# Patient Record
Sex: Male | Born: 1975 | Race: Black or African American | Hispanic: No | Marital: Married
Health system: Southern US, Community
[De-identification: ages and names within clinical notes are randomized; demographics above are authoritative.]

## PROBLEM LIST (undated history)

## (undated) DIAGNOSIS — I10 Essential (primary) hypertension: Secondary | ICD-10-CM

---

## 2018-10-03 ENCOUNTER — Emergency Department (HOSPITAL_COMMUNITY): Payer: Self-pay

## 2018-10-03 ENCOUNTER — Other Ambulatory Visit: Payer: Self-pay

## 2018-10-03 ENCOUNTER — Emergency Department (HOSPITAL_COMMUNITY)
Admission: EM | Admit: 2018-10-03 | Discharge: 2018-10-03 | Disposition: A | Discharge status: Home / Self Care | Payer: Self-pay | Attending: Emergency Medicine | Admitting: Emergency Medicine

## 2018-10-03 ENCOUNTER — Encounter (HOSPITAL_COMMUNITY): Payer: Self-pay | Admitting: Emergency Medicine

## 2018-10-03 DIAGNOSIS — N433 Hydrocele, unspecified: Secondary | ICD-10-CM | POA: Insufficient documentation

## 2018-10-03 DIAGNOSIS — E278 Other specified disorders of adrenal gland: Secondary | ICD-10-CM | POA: Insufficient documentation

## 2018-10-03 DIAGNOSIS — R030 Elevated blood-pressure reading, without diagnosis of hypertension: Secondary | ICD-10-CM

## 2018-10-03 DIAGNOSIS — A084 Viral intestinal infection, unspecified: Secondary | ICD-10-CM | POA: Insufficient documentation

## 2018-10-03 DIAGNOSIS — I700 Atherosclerosis of aorta: Secondary | ICD-10-CM | POA: Insufficient documentation

## 2018-10-03 DIAGNOSIS — I10 Essential (primary) hypertension: Secondary | ICD-10-CM | POA: Insufficient documentation

## 2018-10-03 DIAGNOSIS — I7090 Unspecified atherosclerosis: Secondary | ICD-10-CM

## 2018-10-03 DIAGNOSIS — R55 Syncope and collapse: Secondary | ICD-10-CM | POA: Insufficient documentation

## 2018-10-03 DIAGNOSIS — R932 Abnormal findings on diagnostic imaging of liver and biliary tract: Secondary | ICD-10-CM | POA: Insufficient documentation

## 2018-10-03 DIAGNOSIS — K769 Liver disease, unspecified: Secondary | ICD-10-CM

## 2018-10-03 LAB — URINALYSIS, ROUTINE W REFLEX MICROSCOPIC
Bilirubin Urine: NEGATIVE
Glucose, UA: NEGATIVE mg/dL
Ketones, ur: 20 mg/dL — AB
Leukocytes,Ua: NEGATIVE
Nitrite: NEGATIVE
Protein, ur: 30 mg/dL — AB
Specific Gravity, Urine: 1.018 (ref 1.005–1.030)
pH: 7 (ref 5.0–8.0)

## 2018-10-03 LAB — CBC WITH DIFFERENTIAL/PLATELET
Abs Immature Granulocytes: 0.05 10*3/uL (ref 0.00–0.07)
Basophils Absolute: 0 10*3/uL (ref 0.0–0.1)
Basophils Relative: 0 %
Eosinophils Absolute: 0 10*3/uL (ref 0.0–0.5)
Eosinophils Relative: 0 %
HCT: 45.2 % (ref 39.0–52.0)
Hemoglobin: 15.2 g/dL (ref 13.0–17.0)
Immature Granulocytes: 0 %
Lymphocytes Relative: 12 %
Lymphs Abs: 1.5 10*3/uL (ref 0.7–4.0)
MCH: 31.3 pg (ref 26.0–34.0)
MCHC: 33.6 g/dL (ref 30.0–36.0)
MCV: 93.2 fL (ref 80.0–100.0)
Monocytes Absolute: 0.6 10*3/uL (ref 0.1–1.0)
Monocytes Relative: 5 %
Neutro Abs: 10.5 10*3/uL — ABNORMAL HIGH (ref 1.7–7.7)
Neutrophils Relative %: 83 %
Platelets: 260 10*3/uL (ref 150–400)
RBC: 4.85 MIL/uL (ref 4.22–5.81)
RDW: 13.3 % (ref 11.5–15.5)
WBC: 12.7 10*3/uL — ABNORMAL HIGH (ref 4.0–10.5)
nRBC: 0 % (ref 0.0–0.2)

## 2018-10-03 LAB — COMPREHENSIVE METABOLIC PANEL
ALT: 14 U/L (ref 0–44)
AST: 18 U/L (ref 15–41)
Albumin: 4.5 g/dL (ref 3.5–5.0)
Alkaline Phosphatase: 68 U/L (ref 38–126)
Anion gap: 11 (ref 5–15)
BUN: 11 mg/dL (ref 6–20)
CO2: 20 mmol/L — ABNORMAL LOW (ref 22–32)
Calcium: 10.2 mg/dL (ref 8.9–10.3)
Chloride: 109 mmol/L (ref 98–111)
Creatinine, Ser: 1.37 mg/dL — ABNORMAL HIGH (ref 0.61–1.24)
GFR calc Af Amer: >60 mL/min (ref >60)
GFR calc non Af Amer: >60 mL/min (ref >60)
Glucose, Bld: 117 mg/dL — ABNORMAL HIGH (ref 70–99)
Potassium: 3.8 mmol/L (ref 3.5–5.1)
Sodium: 140 mmol/L (ref 135–145)
Total Bilirubin: 0.6 mg/dL (ref 0.3–1.2)
Total Protein: 7.7 g/dL (ref 6.5–8.1)

## 2018-10-03 LAB — TROPONIN I: Troponin I: <0.03 ng/mL (ref <0.03)

## 2018-10-03 LAB — LIPASE, BLOOD: Lipase: 32 U/L (ref 11–51)

## 2018-10-03 MED ORDER — ONDANSETRON HCL 4 MG/2ML IJ SOLN
2.0000 mg | Freq: Once | INTRAMUSCULAR | Status: AC
  Administered 2018-10-03: 2 mg via INTRAVENOUS
  Filled 2018-10-03: qty 2

## 2018-10-03 MED ORDER — IOHEXOL 300 MG/ML  SOLN
100.0000 mL | Freq: Once | INTRAMUSCULAR | Status: AC | PRN
  Administered 2018-10-03: 100 mL via INTRAVENOUS

## 2018-10-03 MED ORDER — AMLODIPINE BESYLATE 5 MG PO TABS: 5.0000 mg | ORAL_TABLET | Freq: Every day | ORAL | 0 refills | Status: AC

## 2018-10-03 MED ORDER — ONDANSETRON HCL 4 MG/2ML IJ SOLN
4.0000 mg | Freq: Once | INTRAMUSCULAR | Status: AC
  Administered 2018-10-03: 4 mg via INTRAVENOUS
  Filled 2018-10-03: qty 2

## 2018-10-03 MED ORDER — SODIUM CHLORIDE 0.9 % IV BOLUS
1000.0000 mL | Freq: Once | INTRAVENOUS | Status: AC
  Administered 2018-10-03: 1000 mL via INTRAVENOUS

## 2018-10-03 MED ORDER — ONDANSETRON 4 MG PO TBDP: 4.0000 mg | ORAL_TABLET | Freq: Three times a day (TID) | ORAL | 0 refills | Status: AC | PRN

## 2018-10-03 NOTE — ED Provider Notes (Signed)
[facility] Provider Note   CSN: [id number] Arrival date & time: 10/03/18  1459    History   Chief Complaint Chief Complaint  Patient presents with   Nausea   Emesis   Diarrhea   Near Syncope    HPI Randy BrooklynBenny L Marjie Skiffatrick Jr. is a 43 y.o. male with history of hypertension presenting today for nausea, vomiting, diarrhea and near syncope. Patient reports sudden onset of nausea and vomiting without abdominal pain at 8am this morning, reports multiple episodes throughout the day. Patient reports that after one of his episodes of n/v he took a shower to clean off and shortly after became light headed and needed to lay down. Patient is unsure if he had a true syncopal episode, reports that he felt he was going "in and out". Patient describes nonbloody/nonbilous emesis thoroughout the day as well as 1-2 two episodes on nonbloody diarrhea. Patient reports he has felt cold and sweaty with vomiting today but has not measured a fever at home, no medication use pta.  Patient reports that he was feeling well last night and this morning prior to onset of symptoms and in his normal state of health.  Patient denies cough/sob, chest pain, sick contacts, drug/alcohol use or any additional concerns.  Of note patient reports that he has not seen a medical provider in greater than 1 decade.    HPI  History reviewed. No pertinent past medical history.  There are no active problems to display for this patient.   History reviewed. No pertinent surgical history.      Home Medications    Prior to Admission medications   Medication Sig Start Date End Date Taking? Authorizing Provider  amLODipine (NORVASC) 5 MG tablet Take 1 tablet (5 mg total) by mouth daily. 10/03/18   Harlene SaltsMorelli, Vamsi Apfel A, PA-C  ondansetron (ZOFRAN ODT) 4 MG disintegrating tablet Take 1 tablet (4 mg total) by mouth every 8 (eight) hours as needed for nausea or vomiting. 10/03/18   Bill SalinasMorelli, Niesha Bame  A, PA-C    Family History No family history on file.  Social History Social History   Tobacco Use   Smoking status: Not on file  Substance Use Topics   Alcohol use: Not on file   Drug use: Yes    Types: Marijuana     Allergies   Patient has no known allergies.   Review of Systems Review of Systems  Constitutional: Positive for chills, fatigue and fever.  Respiratory: Negative.  Negative for cough and shortness of breath.   Cardiovascular: Negative.  Negative for chest pain, palpitations and leg swelling.  Gastrointestinal: Positive for abdominal pain (Only with n/v), diarrhea, nausea and vomiting. Negative for blood in stool.  Genitourinary: Negative.  Negative for dysuria, hematuria, scrotal swelling and testicular pain.  Neurological: Positive for syncope (Questionable). Negative for headaches.  All other systems reviewed and are negative.  Physical Exam Updated Vital Signs BP (!) 188/114 (BP Location: Left Arm)    Pulse 71    Temp 97.7 F (36.5 C) (Oral)    Resp 20    Ht 6\' 3"  (1.905 m)    Wt 113.4 kg    SpO2 96%    BMI 31.25 kg/m   Physical Exam Constitutional:      General: He is not in acute distress.    Appearance: Normal appearance. He is well-developed. He is not ill-appearing or diaphoretic.  HENT:     Head: Normocephalic and atraumatic.     Right  Ear: External ear normal.     Left Ear: External ear normal.     Nose: Nose normal.  Eyes:     General: Vision grossly intact. Gaze aligned appropriately.     Pupils: Pupils are equal, round, and reactive to light.  Neck:     Musculoskeletal: Normal range of motion.     Trachea: Trachea and phonation normal. No tracheal deviation.  Cardiovascular:     Rate and Rhythm: Normal rate and regular rhythm.     Pulses: Normal pulses.     Heart sounds: Normal heart sounds.  Pulmonary:     Effort: Pulmonary effort is normal. No tachypnea, accessory muscle usage or respiratory distress.     Breath sounds: Normal  breath sounds and air entry. No rhonchi.  Chest:     Chest wall: No tenderness.  Abdominal:     General: Bowel sounds are increased. There is no distension.     Palpations: Abdomen is soft.     Tenderness: There is no abdominal tenderness. There is no guarding or rebound. Negative signs include Murphy's sign, Rovsing's sign and McBurney's sign.  Genitourinary:    Comments: Chaperone present during genital exam Amber NT.  No external genital lesions noted, no bumps on head of penis, specifically no vesicles concerning for herpes or chancre suggestive of syphilis.  No pain with palpation of the penis/glans, no discharge or urethritis noted.  Scrotum and testicles without erythema/swelling or tenderness to palpation. Cremasteric reflex intact bilaterally. Palpable fluctuance to right inguinal canal.  Musculoskeletal: Normal range of motion.  Skin:    General: Skin is warm and dry.  Neurological:     Mental Status: He is alert.     GCS: GCS eye subscore is 4. GCS verbal subscore is 5. GCS motor subscore is 6.     Comments: Speech is clear and goal oriented, follows commands Major Cranial nerves without deficit, no facial droop Moves extremities without ataxia, coordination intact Normal Gait  Psychiatric:        Behavior: Behavior normal.    ED Treatments / Results  Labs (all labs ordered are listed, but only abnormal results are displayed) Labs Reviewed  CBC WITH DIFFERENTIAL/PLATELET - Abnormal; Notable for the following components:      Result Value   WBC 12.7 (*)    Neutro Abs 10.5 (*)    All other components within normal limits  COMPREHENSIVE METABOLIC PANEL - Abnormal; Notable for the following components:   CO2 20 (*)    Glucose, Bld 117 (*)    Creatinine, Ser 1.37 (*)    All other components within normal limits  URINALYSIS, ROUTINE W REFLEX MICROSCOPIC - Abnormal; Notable for the following components:   Hgb urine dipstick SMALL (*)    Ketones, ur 20 (*)     Protein, ur 30 (*)    Bacteria, UA RARE (*)    All other components within normal limits  LIPASE, BLOOD  TROPONIN I    EKG EKG Interpretation  Date/Time:  Tuesday October 03 2018 16:34:57 EDT Ventricular Rate:  81 PR Interval:    QRS Duration: 88 QT Interval:  410 QTC Calculation: 476 R Axis:   37 Text Interpretation:  Sinus rhythm Probable left atrial enlargement Left ventricular hypertrophy ST elev, probable normal early repol pattern Borderline prolonged QT interval Baseline wander in lead(s) V6 No old tracing to compare Confirmed by Dorie Rank [phone]) on 10/03/2018 4:44:04 PM   Radiology Ct Abdomen Pelvis W Contrast  Result Date: 10/03/2018  CLINICAL DATA:  Syncope. Diaphoretic. Nausea and vomiting with diarrhea. EXAM: CT ABDOMEN AND PELVIS WITH CONTRAST TECHNIQUE: Multidetector CT imaging of the abdomen and pelvis was performed using the standard protocol following bolus administration of intravenous contrast. CONTRAST:  OMNIPAQUE IOHEXOL 300 MG/ML  SOLN COMPARISON:  None. FINDINGS: Lower chest: Evaluation is limited by motion artifact. The heart size is at the upper limits of normal. The lungs appear clear otherwise. Hepatobiliary: There is an ill-defined 2.1 cm area at the dome of the liver. There are additional smaller subcentimeter hypoattenuating areas in the liver which are favored to represent benign cysts but are not well characterized on this exam. The gallbladder is unremarkable. Pancreas: Unremarkable. No pancreatic ductal dilatation or surrounding inflammatory changes. Spleen: Normal in size without focal abnormality. Adrenals/Urinary Tract: There is a small 1.3 cm left adrenal nodule measuring 56 Hounsfield units. The right adrenal gland is unremarkable. There is no hydronephrosis. There is no hydronephrosis. There are no radiopaque kidney stones. The bladder is unremarkable. Stomach/Bowel: The colon is essentially under distended which limits evaluation. The appendix is  unremarkable. There is no evidence of a small-bowel obstruction. The stomach is unremarkable. Vascular/Lymphatic: Aortic atherosclerosis. No enlarged abdominal or pelvic lymph nodes. Reproductive: The prostate gland is unremarkable. There is a low-density structure in the right inguinal callus measuring 40 Hounsfield units. Other: No abdominal wall hernia or abnormality. No abdominopelvic ascites. Musculoskeletal: No acute or significant osseous findings. IMPRESSION: 1. Examination limited by motion artifact. 2. No definite acute intra-abdominal abnormality detected. 3. Mild wall thickening of the colon which is favored to be secondary to underdistention, less likely infectious or inflammatory colitis. 4. Low-density structure in the right inguinal canal of unknown clinical significance. Correlation with physical exam is recommended. If there is clinical concern for a right testicular process, follow-up with ultrasound is recommended. 5. Indeterminate low-attenuation lesion at the dome of the liver. Follow-up with a nonemergent outpatient ultrasound is recommended for further evaluation. 6. Six small 1.3 cm left-sided adrenal nodule. This is favored to represent a benign adrenal adenoma but is not well characterized on this exam. Consider a three-month follow-up CT to confirm stability of this finding. Aortic Atherosclerosis (ICD10-I70.0). Electronically Signed   By: Katherine Mantle M.D.   On: 10/03/2018 17:30   Dg Chest Portable 1 View  Result Date: 10/03/2018 CLINICAL DATA:  Single episode EXAM: PORTABLE CHEST 1 VIEW COMPARISON:  None. FINDINGS: The heart size and mediastinal contours are within normal limits. Both lungs are clear. The visualized skeletal structures are unremarkable. IMPRESSION: No active disease. Electronically Signed   By: Elige Ko   On: 10/03/2018 16:18   US Scrotum W/doppler  Result Date: 10/03/2018 CLINICAL DATA:  43 y/o  M; right inguinal mass seen on CT. EXAM: SCROTAL  ULTRASOUND DOPPLER ULTRASOUND OF THE TESTICLES TECHNIQUE: Complete ultrasound examination of the testicles, epididymis, and other scrotal structures was performed. Color and spectral Doppler ultrasound were also utilized to evaluate blood flow to the testicles. COMPARISON:  10/03/2018 CT abdomen and pelvis. FINDINGS: Right testicle Measurements: 2.9 x 1.8 x 2.4 cm, 8.6 cc. No mass or microlithiasis visualized. The testicle is within the right inguinal canal. Left testicle Measurements: 3.9 x 2.6 x 3.4 cm, 17.5 cc. No mass or microlithiasis visualized. Right epididymis:  Normal in size and appearance. Left epididymis:  Normal in size and appearance. Hydrocele: Large simple cyst extending from right inguinal canal into the right scrotal measuring 7.3 x 4.7 x 5.9 cm displacing the right testicle into  the right inguinal canal. This appears to be separate from the testicle. Varicocele:  None visualized. Pulsed Doppler interrogation of both testes demonstrates normal low resistance arterial and venous waveforms bilaterally. IMPRESSION: 1. 7.3 cm simple cyst extending from right inguinal canal into right testicle. The cyst appears separate from testicle and probably represents a loculated hydrocele or possibly spermatocele. 2. The right testicle is displaced superiorly into the right inguinal canal and is morphologically normal. The right testicle in the right inguinal canal likely corresponds to abnormality on CT. 3. Normal left testicle. Electronically Signed   By: Mitzi HansenLance  Furusawa-Stratton M.D.   On: 10/03/2018 19:56    Procedures Procedures (including critical care time)  Medications Ordered in ED Medications  sodium chloride 0.9 % bolus 1,000 mL (0 mLs Intravenous Stopping Infusion hung by another clincian 10/03/18 2011)  ondansetron (ZOFRAN) injection 4 mg (4 mg Intravenous Given 10/03/18 1533)  sodium chloride 0.9 % bolus 1,000 mL (0 mLs Intravenous Stopped 10/03/18 2058)  iohexol (OMNIPAQUE) 300 MG/ML  solution 100 mL (100 mLs Intravenous Contrast Given 10/03/18 1705)  ondansetron (ZOFRAN) injection 2 mg (2 mg Intravenous Given 10/03/18 2019)     Initial Impression / Assessment and Plan / ED Course  I have reviewed the triage vital signs and the nursing notes.  Pertinent labs & imaging results that were available during my care of the patient were reviewed by me and considered in my medical decision making (see chart for details).    Initial evaluation patient overall well-appearing and in no acute distress states that he has been persistent nausea/vomiting as well as diarrhea he has no abdominal tenderness or peritoneal signs on my examination, negative murphy sign.  Heart regular rate and rhythm without murmur, lungs clear to auscultation bilaterally, distal pulses intact to all 4 extremities and equal.  No longer actively vomiting, no meds prior to arrival.  Lab work ordered, fluids ordered, Zofran ordered.  Vital signs stable however patient is hypertensive. - Troponin negative Lipase within normal limits CMP with elevated creatinine, suspect secondary to dehydration CBC with leukocytosis of 12.7 with left shift Urinalysis shows small hemoglobin, 20 ketones, 30 protein, rare bacteria, suspicious for dehydration, doubt infection. Chest x-ray:  IMPRESSION:  No active disease.  - Patient reevaluated states improvement of symptoms following Zofran today.  Vital signs remained stable. - EKG Sinus rhythm Probable left atrial enlargement Left ventricular hypertrophy ST elev, probable normal early repol pattern Borderline prolonged QT interval Baseline wander in lead(s) V6 No old tracing to compare Confirmed by Linwood DibblesKnapp, Jon  CT abdomen/pelvis:  IMPRESSION: 1. Examination limited by motion artifact. 2. No definite acute intra-abdominal abnormality detected. 3. Mild wall thickening of the colon which is favored to be secondary to underdistention, less likely infectious or inflammatory colitis. 4.  Low-density structure in the right inguinal canal of unknown clinical significance. Correlation with physical exam is recommended. If there is clinical concern for a right testicular process, follow-up with ultrasound is recommended. 5. Indeterminate low-attenuation lesion at the dome of the liver. Follow-up with a nonemergent outpatient ultrasound is recommended for further evaluation. 6. Six small 1.3 cm left-sided adrenal nodule. This is favored to represent a benign adrenal adenoma but is not well characterized on this exam. Consider a three-month follow-up CT to confirm stability of this finding. Aortic Atherosclerosis  - Patient had initially deferred GU examination, subsequent examination was chaperoned by [facility].  Patient with questionable right inguinal hernia, he is also concerned of a lump to his left testicle  that I am unable to palpate.  No tenderness to palpation of the testicles, no obvious rash, lesion, no penile discharge.  Will obtain ultrasound scrotum for further evaluation. - Scrotal US with Doppler: IMPRESSION: 1. 7.3 cm simple cyst extending from right inguinal canal into right testicle. The cyst appears separate from testicle and probably represents a loculated hydrocele or possibly spermatocele. 2. The right testicle is displaced superiorly into the right inguinal canal and is morphologically normal. The right testicle in the right inguinal canal likely corresponds to abnormality on CT. 3. Normal left testicle. - Case rediscussed with Dr. Lynelle DoctorKnapp, plan of care at this time is discharge with nausea control and PCP/urology follow-up.  Patient is made aware of right inguinal cyst and elevated testicle, has been given urology referral and encouraged to call their office tomorrow to schedule a follow-up appointment.  As to his near syncopal episode today suspect secondary to his nausea vomiting diarrhea and dehydration, gastroenteritis possible vasovagal episode, he is without chest pain or  shortness of breath, he has been fluid resuscitated here in the ED, negative orthostatics he is now tolerating p.o. without nausea or vomiting.  Do not suspect acute cardiopulmonary processes etiology of his near syncope.  Patient be discharged with ODT Zofran, encouraged water rehydration and PCP follow-up. Patient reassessed resting comfortably and in no acute distress he is agreeable to plan above and has no further questions or concerns.  Additionally has been informed of his adrenal nodules, liver lesion and atherosclerosis and that PCP follow-up is strongly encouraged.  [facility] PCP encouraged to call tomorrow to schedule follow-up.  As to patient's asymptomatic hypertension he will be started on amlodipine 5 mg daily encouraged for blood pressure recheck and medication management within 1 week at PCPs office. I instructed the patient to followup with their PCP within 1 week for BP check. I also counseled the patient regarding the signs and symptoms which would require an emergent visit to an emergency department for hypertensive urgency and/or hypertensive emergency.  At this time there does not appear to be any evidence of an acute emergency medical condition and the patient appears stable for discharge with appropriate outpatient follow up. Diagnosis was discussed with patient who verbalizes understanding of care plan and is agreeable to discharge. I have discussed return precautions with patient who verbalizes understanding of return precautions. Patient encouraged to follow-up with their PCP and urology. All questions answered.  Patient's case discussed with Dr. Lynelle DoctorKnapp who agrees with plan to discharge with Zofran and Norvac and outpatient follow-up.   Note: Portions of this report may have been transcribed using voice recognition software. Every effort was made to ensure accuracy; however, inadvertent computerized  transcription errors may still be present.  Final Clinical Impressions(s) / ED Diagnoses   Final diagnoses:  Viral gastroenteritis  Elevated blood pressure reading  Hydrocele, unspecified hydrocele type  Liver lesion  Adrenal nodule [facility](HCC)  Atherosclerosis    ED Discharge Orders         Ordered    ondansetron (ZOFRAN ODT) 4 MG disintegrating tablet  Every 8 hours PRN     10/03/18 2056    amLODipine (NORVASC) 5 MG tablet  Daily     10/03/18 2105           Elizabeth PalauMorelli, Eugenia Eldredge A, PA-C 10/03/18 2145    Linwood DibblesKnapp, Jon, MD 10/06/18 [phone]

## 2018-10-03 NOTE — Discharge Instructions (Addendum)
You have been diagnosed today with viral gastroenteritis, elevated blood pressure, hydrocele, liver lesion, adrenal nodule, atherosclerosis.  At this time there does not appear to be the presence of an emergent medical condition, however there is always the potential for conditions to change. Please read and follow the below instructions.  Please return to the Emergency Department immediately for any new or worsening symptoms or if your symptoms do not improve within 2 days. Please be sure to follow up with your Primary Care Provider within one week regarding your visit today; please call their office to schedule an appointment even if you are feeling better for a follow-up visit. You may use the medication Zofran as prescribed to help with nausea and vomiting.  Be sure to drink plenty of water over the next few days to avoid dehydration.  Follow-up with your primary care provider this week for recheck. Your blood pressure was elevated today, please begin taking the blood pressure medication Norvasc as prescribed daily to treat your high blood pressure.  Please call your primary care doctor's office tomorrow to schedule blood pressure recheck and medication management.  If you do not have a primary care doctor you may call the [place] to establish a primary care doctor.  Please do this tomorrow. Please call Dr. Tresa Moore at [facility] urology tomorrow to discuss your right hydrocele and right inguinal cyst for follow-up and further investigation. Your CT scan today showed a liver lesion, atherosclerosis as well as an adrenal nodule, these will need to be followed up by CT scan and ultrasound.  Please discuss this with your primary care doctor tomorrow to schedule these imaging tests.  Get help right away if: You get a very bad headache. You start to feel confused. You feel weak or numb. You feel faint. You get very bad pain in your: Chest. Belly (abdomen). You  throw up (vomit) more than once. You have trouble breathing. Get help right away if: You have chest pain. You feel very weak or you pass out (faint). You see blood in your throw-up. Your throw-up looks like coffee grounds. You have bloody or black poop (stools) or poop that look like tar. You have a very bad headache, a stiff neck, or both. You have a rash. You have very bad pain, cramping, or bloating in your belly (abdomen). You have trouble breathing. You are breathing very quickly. Your heart is beating very quickly. Your skin feels cold and clammy. You feel confused. You have pain when you pee. You have signs of dehydration, such as: Dark pee, hardly any pee, or no pee. Cracked lips. Dry mouth. Sunken eyes. Sleepiness. Weakness.  Please read the additional information packets attached to your discharge summary.  Do not take your medicine if  develop an itchy rash, swelling in your mouth or lips, or difficulty breathing; call 911 and seek immediate emergency medical attention if this occurs.

## 2018-10-03 NOTE — ED Triage Notes (Signed)
[facility]- pt arrives from home. Pt had a syncopal episode today at home. Pt is diaphoretic. Pts HR has been between 40s-80s. Pt has had NVD.    186/127 95% RA 130 CBG 97.9 temp 18 RR

## 2020-04-01 ENCOUNTER — Encounter (HOSPITAL_COMMUNITY): Payer: Self-pay | Admitting: Emergency Medicine

## 2020-04-01 ENCOUNTER — Emergency Department (HOSPITAL_COMMUNITY)
Admission: EM | Admit: 2020-04-01 | Discharge: 2020-04-01 | Disposition: A | Discharge status: Home / Self Care | Payer: Self-pay | Attending: Emergency Medicine | Admitting: Emergency Medicine

## 2020-04-01 DIAGNOSIS — G510 Bell's palsy: Secondary | ICD-10-CM | POA: Insufficient documentation

## 2020-04-01 MED ORDER — PREDNISONE 20 MG PO TABS: 60.0000 mg | ORAL_TABLET | Freq: Every day | ORAL | 0 refills | Status: AC

## 2020-04-01 NOTE — ED Notes (Signed)
PA Belaya in triage to assess patient for suspected bells palsy. PA Belaya advised this RN that pt does not require stroke work up at this time.

## 2020-04-01 NOTE — Discharge Instructions (Signed)
Your symptoms are likely due to something called Bell's palsy.  We do not exactly know the cause of this however is not life-threatening.  Please see the handout provided for more information.  Please take the steroids once daily for 7 days.  You may also use over-the-counter eyedrops for your left eye if this begins to bother you.  You may also take Tylenol or ibuprofen.  Your symptoms should resolve on its own.  Please follow-up with your primary care doctor.  If you do not have a primary care doctor, I have provided the contact information for [facility] which is a free clinic in the area.  Return to the ER for any new or worsening symptoms

## 2020-04-01 NOTE — ED Provider Notes (Signed)
[facility] EMERGENCY DEPARTMENT Provider Note   CSN: [id number] Arrival date & time: 04/01/20  1127     History Chief Complaint  Patient presents with  . Numbness    Randy Singh. is a 44 y.o. male.  Patient presents for assessment of left facial weakness and numbness that he woke up with this morning at 4 in the morning.  No history of similar.  No stroke history.  No unilateral numbness weakness or tingling in extremities.  No blood thinner use.  No concerning headaches.        History reviewed. No pertinent past medical history.  There are no problems to display for this patient.   History reviewed. No pertinent surgical history.     No family history on file.  Social History   Substance Use Topics  . Drug use: Yes    Types: Marijuana    Home Medications Prior to Admission medications   Medication Sig Start Date End Date Taking? Authorizing Provider  amLODipine (NORVASC) 5 MG tablet Take 1 tablet (5 mg total) by mouth daily. 10/03/18   Harlene Salts A, PA-C  ondansetron (ZOFRAN ODT) 4 MG disintegrating tablet Take 1 tablet (4 mg total) by mouth every 8 (eight) hours as needed for nausea or vomiting. 10/03/18   Harlene Salts A, PA-C  predniSONE (DELTASONE) 20 MG tablet Take 3 tablets (60 mg total) by mouth daily for 7 days. 04/01/20 04/08/20  Mare Ferrari, PA-C    Allergies    Patient has no known allergies.  Review of Systems   Review of Systems  Constitutional: Negative for chills and fever.  HENT: Negative for congestion.   Eyes: Negative for visual disturbance.  Respiratory: Negative for shortness of breath.   Cardiovascular: Negative for chest pain.  Gastrointestinal: Negative for abdominal pain and vomiting.  Genitourinary: Negative for dysuria and flank pain.  Musculoskeletal: Negative for back pain, neck pain and neck stiffness.  Skin: Negative for rash.  Neurological: Positive for weakness and numbness.  Negative for light-headedness and headaches.    Physical Exam Updated Vital Signs BP (!) 163/124   Pulse 89   Temp 98.8 F (37.1 C) (Oral)   Resp 14   Ht 6\' 3"  (1.905 m)   Wt 117.9 kg   SpO2 98%   BMI 32.50 kg/m   Physical Exam Vitals and nursing note reviewed.  Constitutional:      Appearance: He is well-developed and well-nourished.  HENT:     Head: Normocephalic and atraumatic.  Eyes:     General:        Right eye: No discharge.        Left eye: No discharge.     Conjunctiva/sclera: Conjunctivae normal.  Neck:     Trachea: No tracheal deviation.  Cardiovascular:     Rate and Rhythm: Normal rate.  Pulmonary:     Effort: Pulmonary effort is normal.  Abdominal:     General: There is no distension.     Palpations: Abdomen is soft.     Tenderness: There is no abdominal tenderness. There is no guarding.  Musculoskeletal:        General: No edema.     Cervical back: Normal range of motion and neck supple.  Skin:    General: Skin is warm.     Findings: No rash.  Neurological:     Mental Status: He is alert and oriented to person, place, and time.     Cranial Nerves: Cranial  nerve deficit present.     Comments: Patient has left-sided facial droop, unable to fully raise left eyebrow or completely close left eye.  Extraocular muscle function intact.  Equal 5+ strength upper and lower extremities without arm drift.  Normal gait.  Psychiatric:        Mood and Affect: Mood and affect and mood normal.     ED Results / Procedures / Treatments   Labs (all labs ordered are listed, but only abnormal results are displayed) Labs Reviewed - No data to display  EKG None  Radiology No results found.  Procedures Procedures (including critical care time)  Medications Ordered in ED Medications - No data to display  ED Course  I have reviewed the triage vital signs and the nursing notes.  Pertinent labs & imaging results that were available during my care of the patient  were reviewed by me and considered in my medical decision making (see chart for details).    MDM Rules/Calculators/A&P                          Patient presents with clinically Bell's palsy, currently on milder spectrum and plan for oral steroids and close outpatient follow-up.  No signs of stroke at this time. Reasons to return, saline eyedrops and eye protection discussed.  Final Clinical Impression(s) / ED Diagnoses Final diagnoses:  Bell's palsy    Rx / DC Orders ED Discharge Orders         Ordered    predniSONE (DELTASONE) 20 MG tablet  Daily        04/01/20 1406           Blane Ohara, MD 04/01/20 1429

## 2020-04-01 NOTE — ED Triage Notes (Signed)
Pt reports that he went to bed around 11-12 last night, upon waking this morning at 4am he noticed numbness and weakness to L side of face. Speech clear. Moves arms and legs equally without weakness. Ambulatory. Does endorse some blurred vision. Had covid in September. Facial asymmetry noted to L side of mouth and eye brow.

## 2020-04-01 NOTE — ED Notes (Signed)
Patient verbalized understanding of dc instructions, vss, ambulatory with nad.   

## 2020-04-01 NOTE — ED Provider Notes (Addendum)
MSE was initiated and I personally evaluated the patient and placed orders (if any) at  2:08 PM on April 01, 2020.  The patient appears stable so that the remainder of the MSE may be completed by another provider.   I was asked initiate evaluation in triage of this patient by nursing staff.  In short, 44 year old male who woke up at around 4 AM this morning with left-sided facial droop. LKN 11-12 pm last night.  Feels like he cannot move his eyebrows.  Has been having trouble blinking with his left eye.  Feels like his vision is blurry. Also endorses feeling like his  left ear is full.   He is afraid that he has a stroke.  States he feels like the left side of his mouth is not working properly.  Denies any dizziness, unilateral weakness of his extremities.  No prior history of stroke.  He is not on any anticoagulation.  He states he had Covid back in September.   On physical exam, patient is alert, thought content appropriate, able to give a coherent history.  Speech is fluent without evidence of aphasia.  Able to follow two-step commands without difficulty. Cranial neerves II:  Peripheral visual fields grossly normal, pupils equal, round, reactive to light. No visual field cuts on my exam III,IV, VI:  Mild ptosis present in left eye, extra-ocular motions intact bilaterally  V,VII:  Left sided facial droop noted w/ smile, mild left eyebrow droop noted, pt unable to raise left eyebrow. Unintended left eye blinking with attempt to smile and incomplete closure of left eyelid noted. Pt unable to puff out cheek on the left VIII: hearing grossly normal to voice  X: uvula elevates symmetrically  XI: bilateral shoulder shrug symmetric and strong XII: midline tongue extension without fassiculations Motor:  Normal tone. 5/5 strength of BUE and BLE major muscle groups including strong and equal grip strength and dorsiflexion/plantar flexion Sensory: light touch normal in all extremities. Cerebellar:  normal finger-to-nose with bilateral upper extremities, Romberg sign absent Gait: normal gait and balance. Able to walk on toes and heels with ease.    Lung sounds clear, RRR, abdomen soft and nontender.    Findings likely consistent with Bell's palsy rather than acute stroke as he is unable to raise his eyebrow on the left, as well as involuntary left eye blinking with smiling with incomplete closure of the left eyelid.  Per chart review, patient does not have risk factors for stroke per medical history.     Pt was informed of reassuring workup. Bell's palsy explained to him, informed that this should resolve on its own. Encouraged over the counter eye drops for left eye.   Low suspicion for stoke. Stable for discharge with PCP followup   This was a shared visit with my supervising physician Dr. Jodi Mourning who independently saw and evaluated the patient & provided guidance in evaluation/management/disposition ,in agreement with care     Mare Ferrari, PA-C 04/01/20 1408    Blane Ohara, MD 04/01/20 1600

## 2020-07-22 ENCOUNTER — Emergency Department (HOSPITAL_COMMUNITY): Payer: No Typology Code available for payment source

## 2020-07-22 ENCOUNTER — Emergency Department (HOSPITAL_COMMUNITY)
Admission: EM | Admit: 2020-07-22 | Discharge: 2020-07-22 | Disposition: A | Discharge status: Home / Self Care | Payer: No Typology Code available for payment source | Attending: Emergency Medicine | Admitting: Emergency Medicine

## 2020-07-22 ENCOUNTER — Other Ambulatory Visit: Payer: Self-pay

## 2020-07-22 ENCOUNTER — Encounter (HOSPITAL_COMMUNITY): Payer: Self-pay

## 2020-07-22 DIAGNOSIS — S29012A Strain of muscle and tendon of back wall of thorax, initial encounter: Secondary | ICD-10-CM | POA: Diagnosis not present

## 2020-07-22 DIAGNOSIS — I10 Essential (primary) hypertension: Secondary | ICD-10-CM | POA: Insufficient documentation

## 2020-07-22 DIAGNOSIS — Z79899 Other long term (current) drug therapy: Secondary | ICD-10-CM | POA: Diagnosis not present

## 2020-07-22 DIAGNOSIS — S46812A Strain of other muscles, fascia and tendons at shoulder and upper arm level, left arm, initial encounter: Secondary | ICD-10-CM

## 2020-07-22 DIAGNOSIS — S39012A Strain of muscle, fascia and tendon of lower back, initial encounter: Secondary | ICD-10-CM | POA: Diagnosis not present

## 2020-07-22 DIAGNOSIS — S4992XA Unspecified injury of left shoulder and upper arm, initial encounter: Secondary | ICD-10-CM | POA: Diagnosis present

## 2020-07-22 DIAGNOSIS — Y92415 Exit ramp or entrance ramp of street or highway as the place of occurrence of the external cause: Secondary | ICD-10-CM | POA: Diagnosis not present

## 2020-07-22 DIAGNOSIS — Y999 Unspecified external cause status: Secondary | ICD-10-CM | POA: Diagnosis not present

## 2020-07-22 HISTORY — DX: Essential (primary) hypertension: I10

## 2020-07-22 MED ORDER — ACETAMINOPHEN 500 MG PO TABS
1000.0000 mg | ORAL_TABLET | Freq: Once | ORAL | Status: AC
  Administered 2020-07-22: 1000 mg via ORAL
  Filled 2020-07-22: qty 2

## 2020-07-22 MED ORDER — CYCLOBENZAPRINE HCL 10 MG PO TABS: 10.0000 mg | ORAL_TABLET | Freq: Every day | ORAL | 0 refills | Status: AC

## 2020-07-22 MED ORDER — CYCLOBENZAPRINE HCL 10 MG PO TABS
10.0000 mg | ORAL_TABLET | Freq: Once | ORAL | Status: AC
  Administered 2020-07-22: 10 mg via ORAL
  Filled 2020-07-22: qty 1
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
## 2020-07-22 NOTE — ED Notes (Signed)
Pt aware of high BP. Currently does not take prescribed BP medication.

## 2020-07-22 NOTE — ED Notes (Signed)
Pt states he has a ride home
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
## 2020-10-23 ENCOUNTER — Ambulatory Visit: Payer: Self-pay | Admitting: Orthopaedic Surgery

## 2020-11-11 ENCOUNTER — Ambulatory Visit: Payer: Self-pay | Admitting: Orthopaedic Surgery

## 2020-11-17 IMAGING — CR PORTABLE CHEST - 1 VIEW
2 series · 2 of 2 positions shown · non-contrast
Comparison: None.

CLINICAL DATA: Single episode

EXAM:
PORTABLE CHEST 1 VIEW

[AP (1 of 2)]
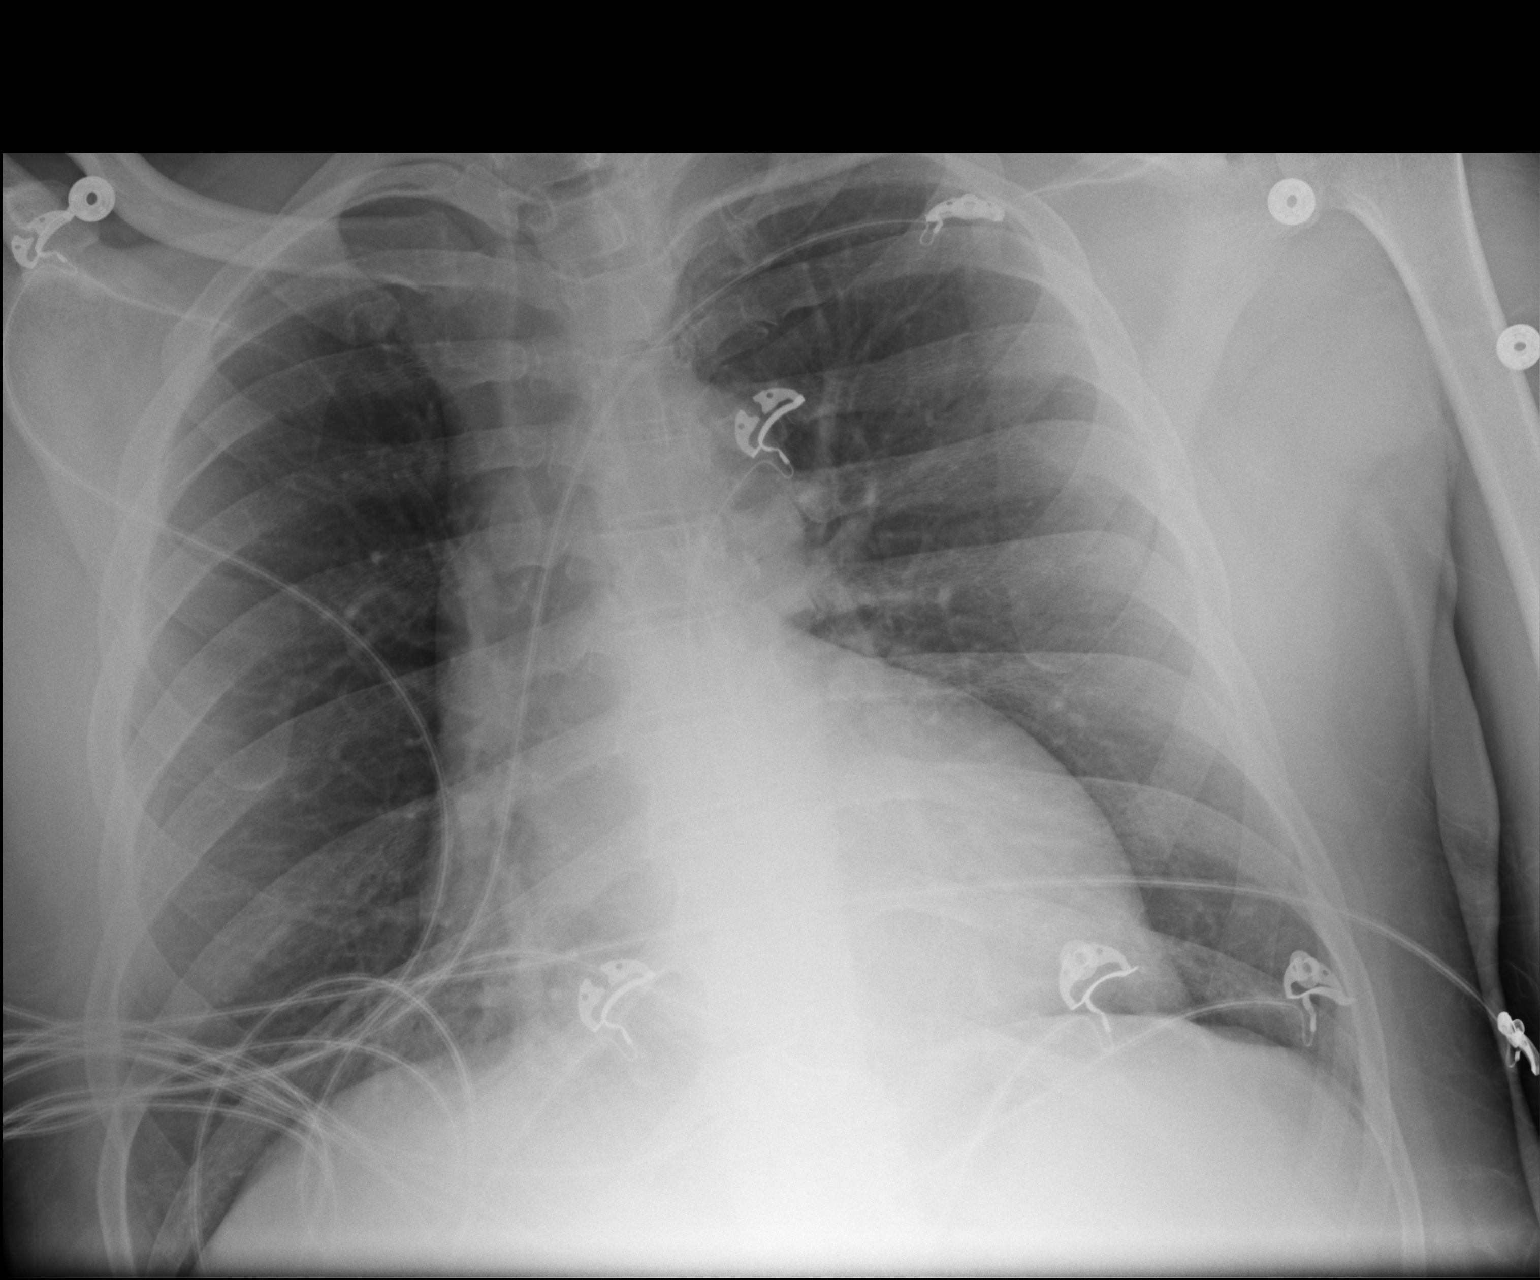

[AP (2 of 2)]
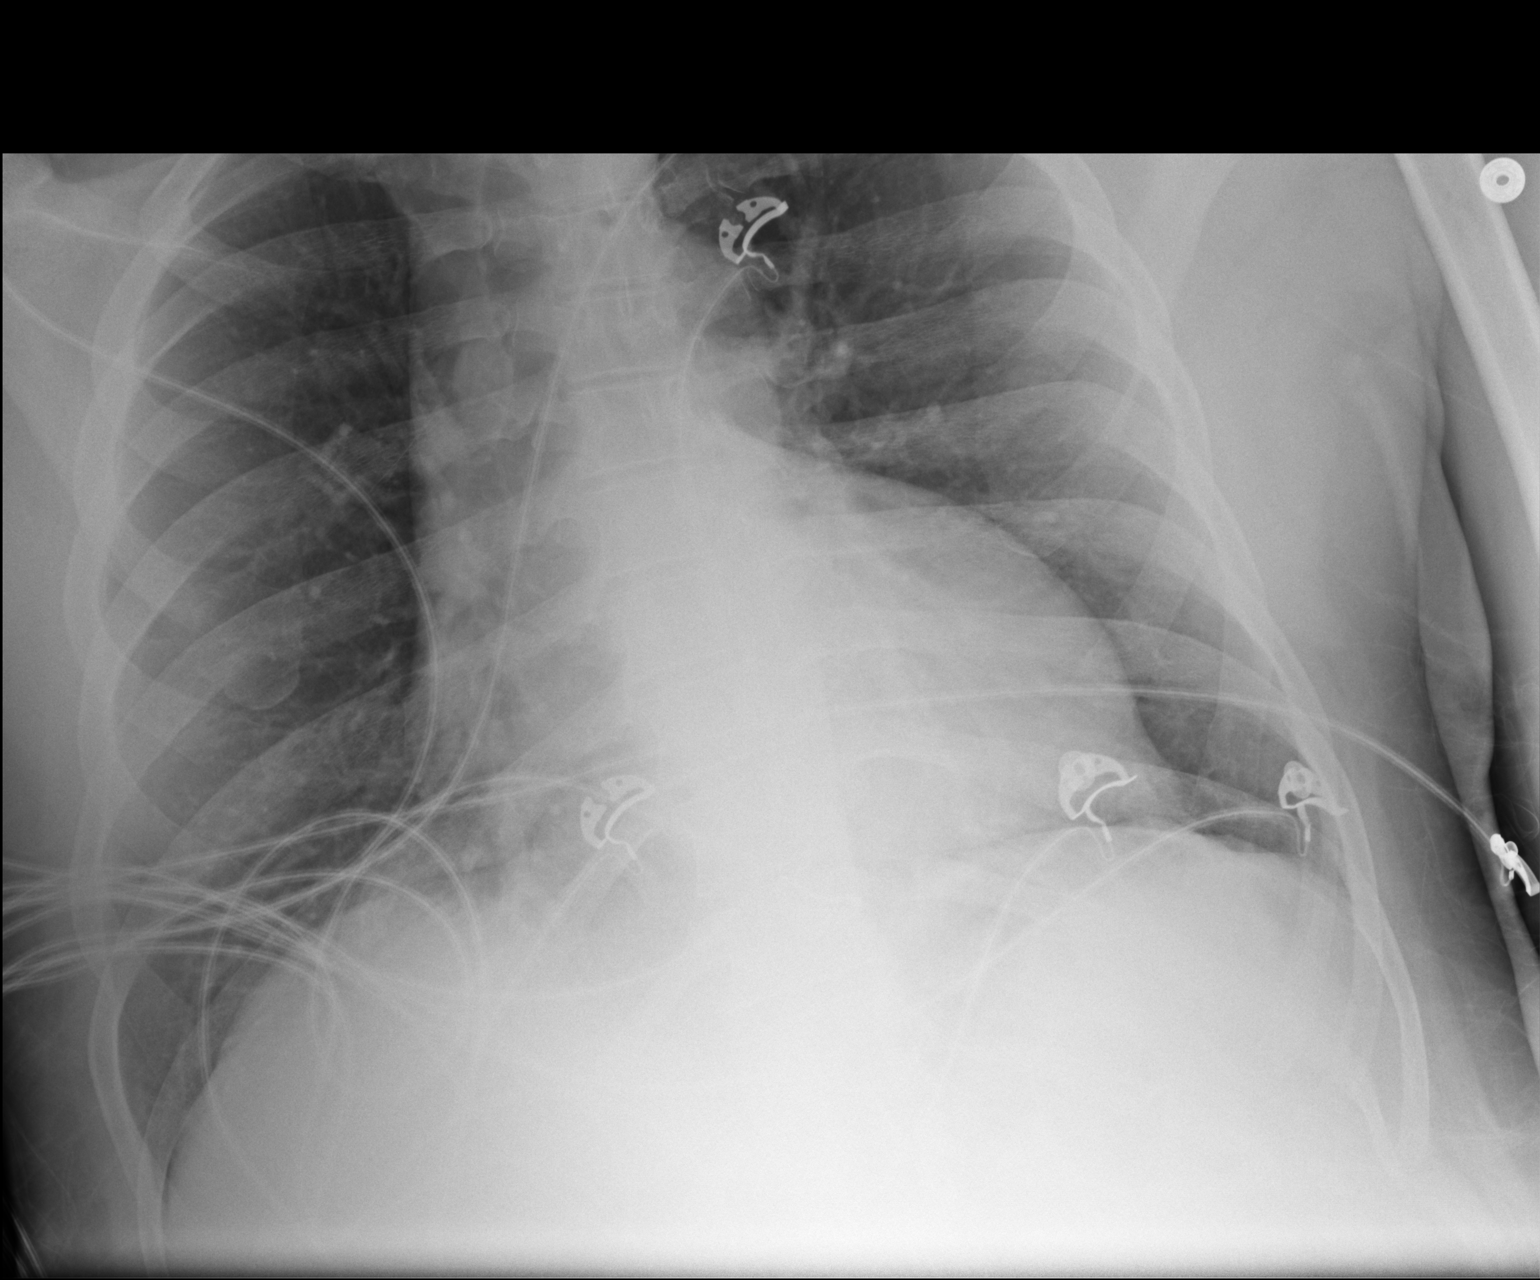

[2 of 2 positions shown; findings below may reference images not displayed]

FINDINGS: The heart size and mediastinal contours are within normal limits.
Both lungs are clear. The visualized skeletal structures are
unremarkable.
IMPRESSION: No active disease.

## 2020-11-17 IMAGING — CT CT ABDOMEN AND PELVIS WITH CONTRAST
2 of 5 series · 16 of 46 positions shown, 18 images · IV contrast (APPLIED)
Comparison: None.

CLINICAL DATA: Syncope. Diaphoretic. Nausea and vomiting with
diarrhea.

EXAM:
CT ABDOMEN AND PELVIS WITH CONTRAST
TECHNIQUE: Multidetector CT imaging of the abdomen and pelvis was performed
using the standard protocol following bolus administration of
intravenous contrast.
CONTRAST:  100mL OMNIPAQUE IOHEXOL 300 MG/ML  SOLN

[Series 3: abd/ pelvis 5.0 i30f 2 · axial · 0.94mm/px · z∈[+798,+1273]mm · 13 of 107 slices shown, 15 images]
[im 6/107  soft-tissue]
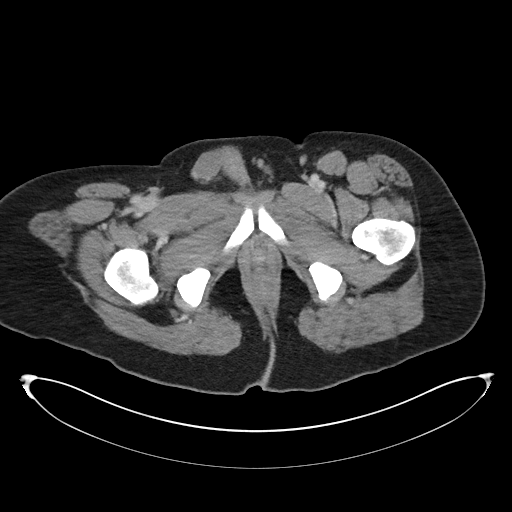
[im 6/107  bone]
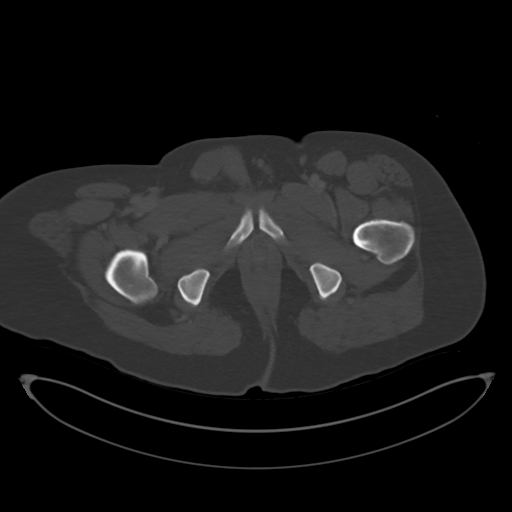
[im 12/107  soft-tissue]
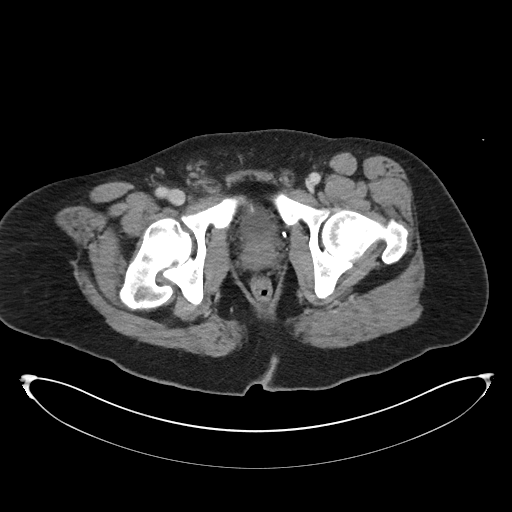
[im 24/107  soft-tissue]
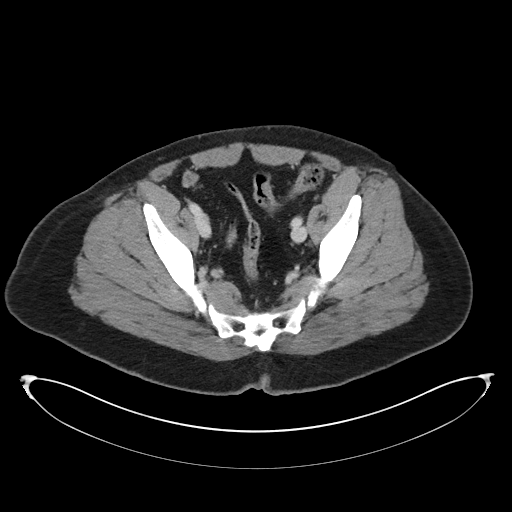
[im 30/107  soft-tissue]
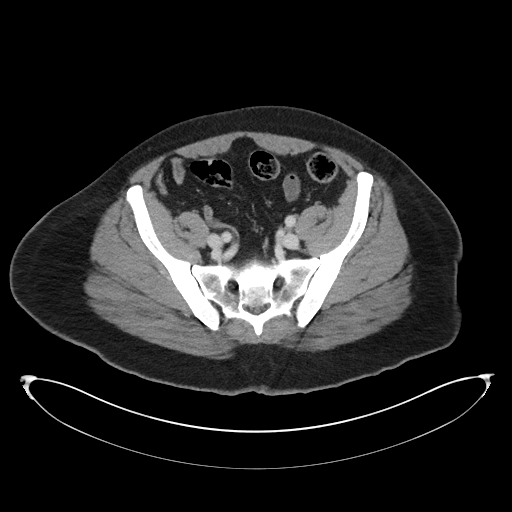
[im 36/107  soft-tissue]
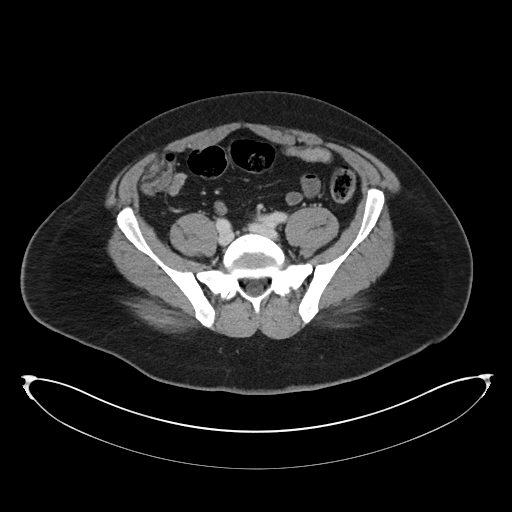
[im 48/107  soft-tissue]
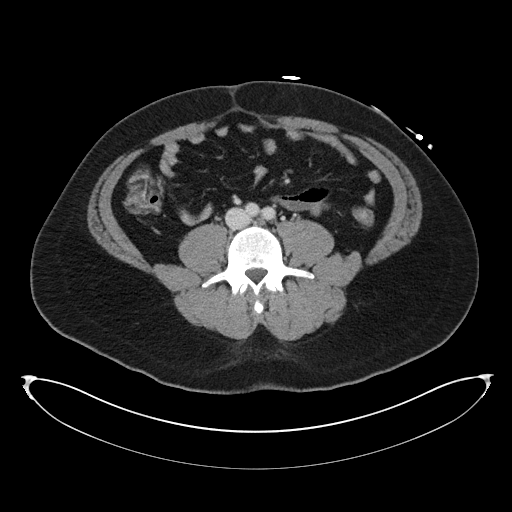
[im 54/107  soft-tissue]
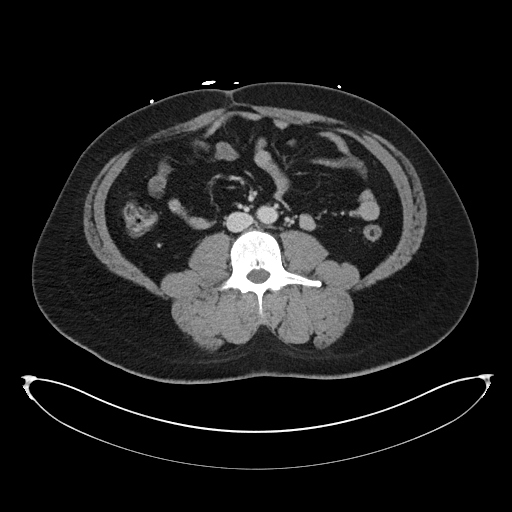
[im 59/107  soft-tissue]
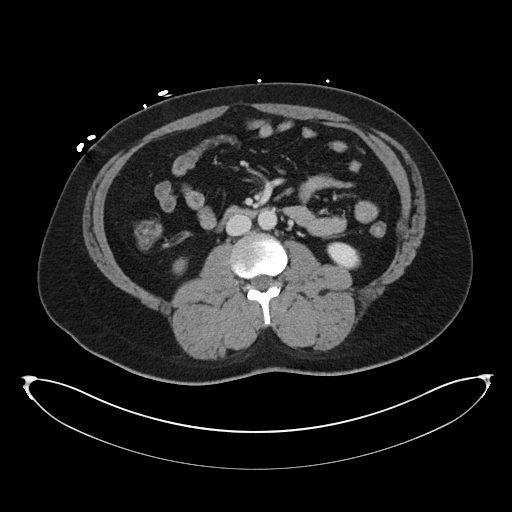
[im 71/107  soft-tissue]
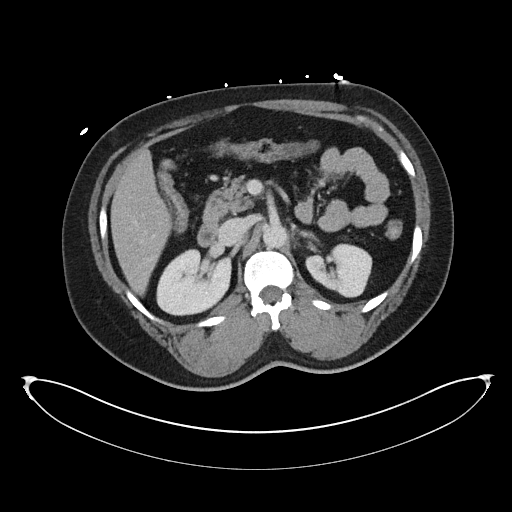
[im 71/107  bone]
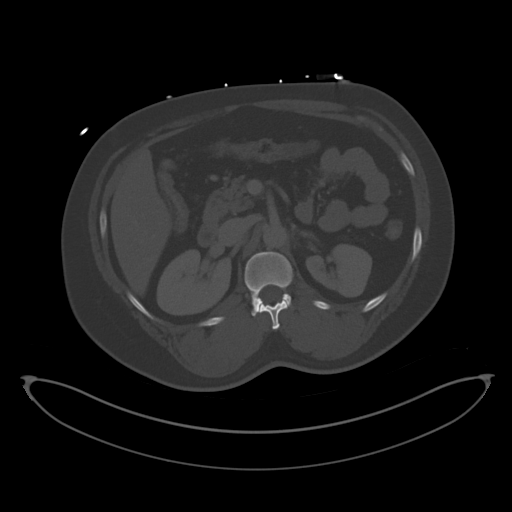
[im 77/107  soft-tissue]
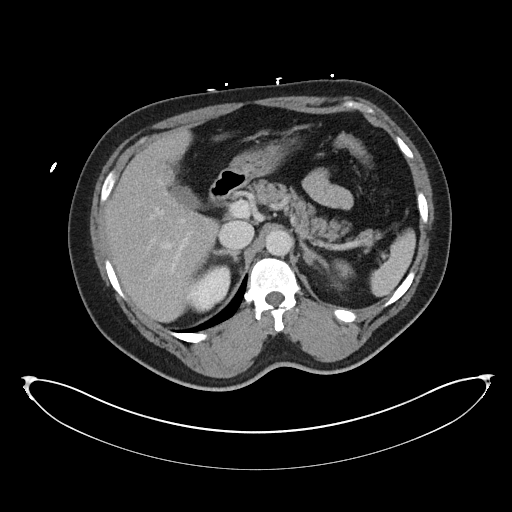
[im 83/107  soft-tissue]
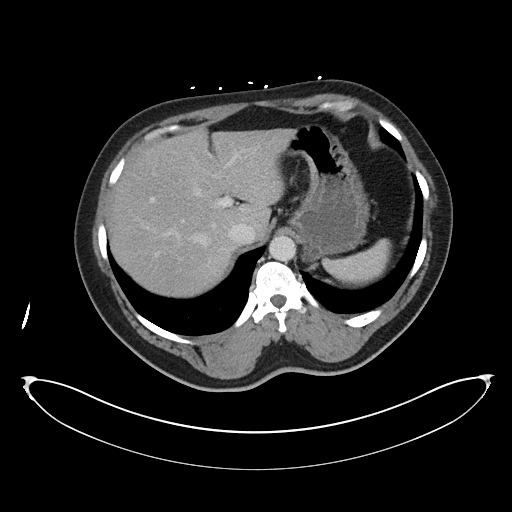
[im 95/107  soft-tissue]
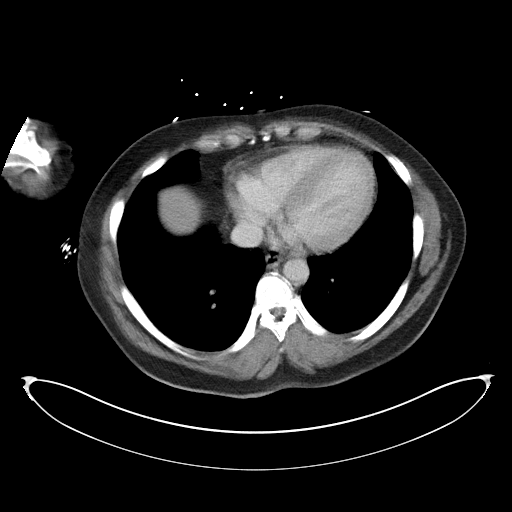
[im 101/107  soft-tissue]
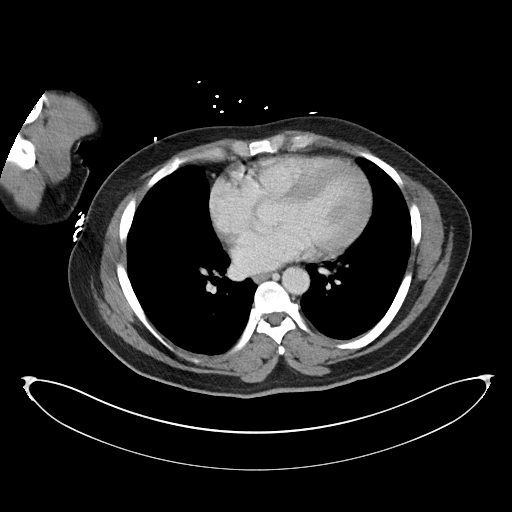

[Series 6: coronal soft tissue · coronal · 1.04mm/px · 3 of 113 slices shown]
[im 38/113  soft-tissue]
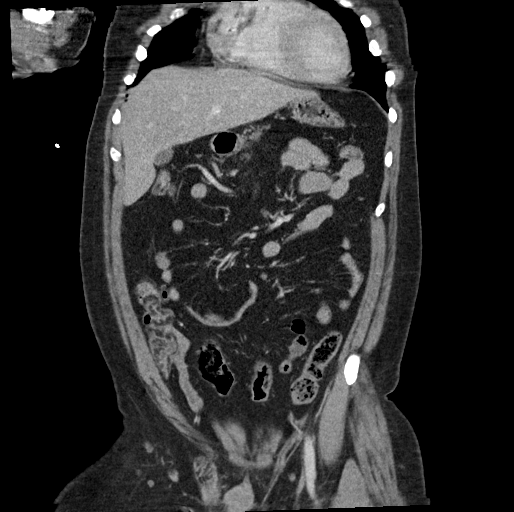
[im 50/113  soft-tissue]
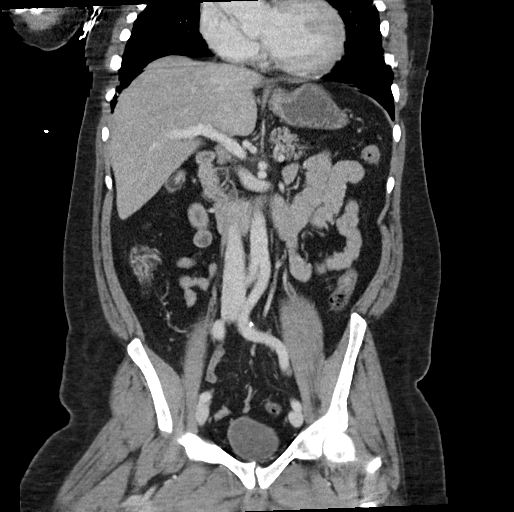
[im 63/113  soft-tissue]
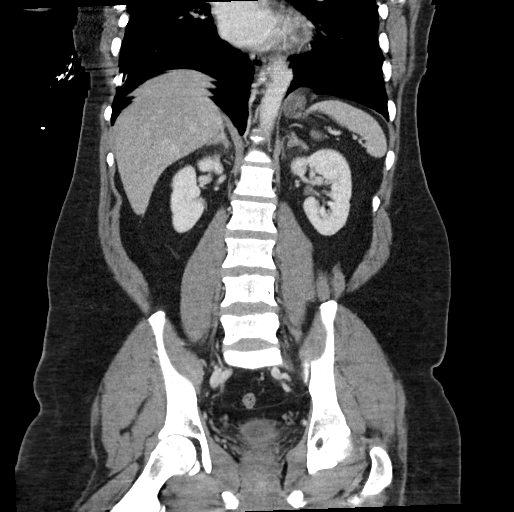

[16 of 46 positions shown; findings below may reference images not displayed]

FINDINGS: Lower chest: Evaluation is limited by motion artifact. The heart
size is at the upper limits of normal. The lungs appear clear
otherwise.

Hepatobiliary: There is an ill-defined 2.1 cm area at the dome of
the liver. There are additional smaller subcentimeter
hypoattenuating areas in the liver which are favored to represent
benign cysts but are not well characterized on this exam. The
gallbladder is unremarkable.

Pancreas: Unremarkable. No pancreatic ductal dilatation or
surrounding inflammatory changes.

Spleen: Normal in size without focal abnormality.

Adrenals/Urinary Tract: There is a small 1.3 cm left adrenal nodule
measuring 56 Hounsfield units. The right adrenal gland is
unremarkable. There is no hydronephrosis. There is no
hydronephrosis. There are no radiopaque kidney stones. The bladder
is unremarkable.

Stomach/Bowel: The colon is essentially under distended which limits
evaluation. The appendix is unremarkable. There is no evidence of a
small-bowel obstruction. The stomach is unremarkable.

Vascular/Lymphatic: Aortic atherosclerosis. No enlarged abdominal or
pelvic lymph nodes.

Reproductive: The prostate gland is unremarkable. There is a
low-density structure in the right inguinal callus measuring 40
Hounsfield units.

Other: No abdominal wall hernia or abnormality. No abdominopelvic
ascites.

Musculoskeletal: No acute or significant osseous findings.
IMPRESSION: 1. Examination limited by motion artifact.
2. No definite acute intra-abdominal abnormality detected.
3. Mild wall thickening of the colon which is favored to be
secondary to underdistention, less likely infectious or inflammatory
colitis.
4. Low-density structure in the right inguinal canal of unknown
clinical significance. Correlation with physical exam is
recommended. If there is clinical concern for a right testicular
process, follow-up with ultrasound is recommended.
5. Indeterminate low-attenuation lesion at the dome of the liver.
Follow-up with a nonemergent outpatient ultrasound is recommended
for further evaluation.
6. Six small 1.3 cm left-sided adrenal nodule. This is favored to
represent a benign adrenal adenoma but is not well characterized on
this exam. Consider a three-month follow-up CT to confirm stability
of this finding.

Aortic Atherosclerosis ([id number]).
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
